# Patient Record
Sex: Female | Born: 1935 | Race: Black or African American | Hispanic: No | Marital: Single | Smoking: Never smoker
Health system: Southern US, Community
[De-identification: ages and names within clinical notes are randomized; demographics above are authoritative.]

## PROBLEM LIST (undated history)

## (undated) DIAGNOSIS — I10 Essential (primary) hypertension: Secondary | ICD-10-CM

## (undated) HISTORY — PX: ABDOMINAL HYSTERECTOMY: SHX81

---

## 2015-10-30 ENCOUNTER — Encounter (HOSPITAL_COMMUNITY): Payer: Self-pay | Admitting: Emergency Medicine

## 2015-10-30 ENCOUNTER — Emergency Department (HOSPITAL_COMMUNITY): Payer: Medicare Other

## 2015-10-30 ENCOUNTER — Emergency Department (HOSPITAL_COMMUNITY)
Admission: EM | Admit: 2015-10-30 | Discharge: 2015-10-30 | Disposition: A | Discharge status: Home / Self Care | Payer: Medicare Other | Attending: Emergency Medicine | Admitting: Emergency Medicine

## 2015-10-30 DIAGNOSIS — Z7902 Long term (current) use of antithrombotics/antiplatelets: Secondary | ICD-10-CM | POA: Insufficient documentation

## 2015-10-30 DIAGNOSIS — Y9289 Other specified places as the place of occurrence of the external cause: Secondary | ICD-10-CM | POA: Insufficient documentation

## 2015-10-30 DIAGNOSIS — Y9389 Activity, other specified: Secondary | ICD-10-CM | POA: Insufficient documentation

## 2015-10-30 DIAGNOSIS — S0191XA Laceration without foreign body of unspecified part of head, initial encounter: Secondary | ICD-10-CM | POA: Insufficient documentation

## 2015-10-30 DIAGNOSIS — W19XXXA Unspecified fall, initial encounter: Secondary | ICD-10-CM

## 2015-10-30 DIAGNOSIS — Z79899 Other long term (current) drug therapy: Secondary | ICD-10-CM | POA: Diagnosis not present

## 2015-10-30 DIAGNOSIS — S0181XA Laceration without foreign body of other part of head, initial encounter: Secondary | ICD-10-CM

## 2015-10-30 DIAGNOSIS — I10 Essential (primary) hypertension: Secondary | ICD-10-CM | POA: Diagnosis not present

## 2015-10-30 DIAGNOSIS — W109XXA Fall (on) (from) unspecified stairs and steps, initial encounter: Secondary | ICD-10-CM | POA: Insufficient documentation

## 2015-10-30 DIAGNOSIS — Y998 Other external cause status: Secondary | ICD-10-CM | POA: Diagnosis not present

## 2015-10-30 DIAGNOSIS — Z880 Allergy status to penicillin: Secondary | ICD-10-CM | POA: Insufficient documentation

## 2015-10-30 DIAGNOSIS — S0990XA Unspecified injury of head, initial encounter: Secondary | ICD-10-CM | POA: Diagnosis present

## 2015-10-30 HISTORY — DX: Essential (primary) hypertension: I10

## 2015-10-30 MED ORDER — TETANUS-DIPHTH-ACELL PERTUSSIS 5-2.5-18.5 LF-MCG/0.5 IM SUSP
0.5000 mL | Freq: Once | INTRAMUSCULAR | Status: AC
  Administered 2015-10-30: 0.5 mL via INTRAMUSCULAR
  Filled 2015-10-30: qty 0.5

## 2015-10-30 MED ORDER — ACETAMINOPHEN 500 MG PO TABS
1000.0000 mg | ORAL_TABLET | Freq: Once | ORAL | Status: AC
  Administered 2015-10-30: 500 mg via ORAL
  Filled 2015-10-30: qty 2

## 2015-10-30 MED ORDER — LIDOCAINE-EPINEPHRINE 1 %-1:100000 IJ SOLN
20.0000 mL | Freq: Once | INTRAMUSCULAR | Status: AC
  Administered 2015-10-30: 1 mL via INTRADERMAL
  Filled 2015-10-30: qty 1

## 2015-10-30 NOTE — ED Provider Notes (Signed)
CSN: [id number]     Arrival date & time 10/30/15  1241 History   First MD Initiated Contact with Patient 10/30/15 1306     Chief Complaint  Patient presents with  . Fall  . Head Injury     (Consider location/radiation/quality/duration/timing/severity/associated sxs/prior Treatment) Patient is a 81 y.o. female presenting with fall and head injury. The history is provided by the patient.  Fall This is a new problem. The current episode started 1 to 2 hours ago. The problem occurs constantly. The problem has not changed since onset.Associated symptoms include headaches. Pertinent negatives include no chest pain, no abdominal pain and no shortness of breath. Nothing aggravates the symptoms. Nothing relieves the symptoms. She has tried nothing for the symptoms. The treatment provided no relief.  Head Injury Associated symptoms: headache   Associated symptoms: no nausea and no vomiting     81 yo F  With a chief complaint of fall. Patient states that she lost her balance and fell down about 3 steps and landed on her face. Complaining of pain to the area of abrasion that hit the concrete. Patient denies other injury denies loss consciousness. Patient is on Plavix.  Denies other blood thinner use.   Past Medical History  Diagnosis Date  . Hypertension    Past Surgical History  Procedure Laterality Date  . Abdominal hysterectomy     No family history on file. Social History  Substance Use Topics  . Smoking status: Never Smoker   . Smokeless tobacco: None  . Alcohol Use: No   OB History    No data available     Review of Systems  Constitutional: Negative for fever and chills.  HENT: Negative for congestion and rhinorrhea.   Eyes: Negative for redness and visual disturbance.  Respiratory: Negative for shortness of breath and wheezing.   Cardiovascular: Negative for chest pain and palpitations.  Gastrointestinal: Negative for nausea, vomiting and abdominal pain.  Genitourinary:  Negative for dysuria and urgency.  Musculoskeletal: Negative for myalgias and arthralgias.  Skin: Positive for wound. Negative for pallor.  Neurological: Positive for headaches. Negative for dizziness.      Allergies  Penicillins  Home Medications   Prior to Admission medications   Medication Sig Start Date End Date Taking? Authorizing Provider  amLODipine-benazepril (LOTREL) 10-40 MG capsule Take 1 capsule by mouth daily.   Yes Historical Provider, MD  clopidogrel (PLAVIX) 75 MG tablet Take 75 mg by mouth daily.   Yes Historical Provider, MD  hydrochlorothiazide (HYDRODIURIL) 25 MG tablet Take 25 mg by mouth daily.   Yes Historical Provider, MD  pravastatin (PRAVACHOL) 10 MG tablet Take 10 mg by mouth daily.   Yes Historical Provider, MD   BP 135/68 mmHg  Pulse 82  Temp(Src) 98.9 F (37.2 C) (Oral)  Resp 12  Ht  (1.702 m)  Wt 210 lb (95.255 kg)  BMI 32.88 kg/m2  SpO2 100% Physical Exam  Constitutional: She is oriented to person, place, and time. She appears well-developed and well-nourished. No distress.  HENT:  Head: Normocephalic and atraumatic.    Eyes: EOM are normal. Pupils are equal, round, and reactive to light.  Neck: Normal range of motion. Neck supple.  Cardiovascular: Normal rate and regular rhythm.  Exam reveals no gallop and no friction rub.   No murmur heard. Pulmonary/Chest: Effort normal. She has no wheezes. She has no rales.  Abdominal: Soft. She exhibits no distension. There is no tenderness. There is no rebound and no guarding.  Musculoskeletal: She exhibits no edema or tenderness.  Neurological: She is alert and oriented to person, place, and time.  Skin: Skin is warm and dry. She is not diaphoretic.     Psychiatric: She has a normal mood and affect. Her behavior is normal.  Nursing note and vitals reviewed.   ED Course  .Marland KitchenLaceration Repair Date/Time: 10/30/2015 3:08 PM Performed by: Adela Lank Lulu Hirschmann Authorized by: Melene Plan Consent: Verbal  consent obtained. Risks and benefits: risks, benefits and alternatives were discussed Consent given by: patient Required items: required blood products, implants, devices, and special equipment available Body area: head/neck Location details: upper lip Full thickness lip laceration: no [place] border involved: no Laceration length: 1 cm Tendon involvement: none Nerve involvement: none Vascular damage: no Anesthesia: local infiltration Local anesthetic: lidocaine 1% with epinephrine Anesthetic total: 4 ml Patient sedated: no Preparation: Patient was prepped and draped in the usual sterile fashion. Irrigation solution: saline Irrigation method: jet lavage Debridement: none Degree of undermining: none Wound skin closure material used: vicryl rapide. Number of sutures: 2 Technique: simple Approximation: close Approximation difficulty: simple Patient tolerance: Patient tolerated the procedure well with no immediate complications  .Marland KitchenLaceration Repair Date/Time: 10/30/2015 3:09 PM Performed by: Adela Lank Reet Scharrer Authorized by: Melene Plan Consent: Verbal consent obtained. Risks and benefits: risks, benefits and alternatives were discussed Consent given by: patient Time out: Immediately prior to procedure a "time out" was called to verify the correct patient, procedure, equipment, support staff and site/side marked as required. Body area: head/neck Location details: forehead Laceration length: 4 cm Tendon involvement: none Nerve involvement: none Vascular damage: no Anesthesia: local infiltration Local anesthetic: lidocaine 1% with epinephrine Anesthetic total: 6 ml Patient sedated: no Preparation: Patient was prepped and draped in the usual sterile fashion. Irrigation method: syringe Amount of cleaning: extensive Debridement: none Degree of undermining: none Wound skin closure material used: vicryl rapide. Number of sutures: 2 Technique: complex (stellate laceration with  avulsion, difficult to approximate) Approximation: close Approximation difficulty: complex Patient tolerance: Patient tolerated the procedure well with no immediate complications   (including critical care time) Labs Review Labs Reviewed - No data to display  Imaging Review Ct Head Wo Contrast  10/30/2015  CLINICAL DATA:  Fall down stairs with pain and facial swelling, initial encounter EXAM: CT HEAD WITHOUT CONTRAST CT MAXILLOFACIAL WITHOUT CONTRAST CT CERVICAL SPINE WITHOUT CONTRAST TECHNIQUE: Multidetector CT imaging of the head, cervical spine, and maxillofacial structures were performed using the standard protocol without intravenous contrast. Multiplanar CT image reconstructions of the cervical spine and maxillofacial structures were also generated. COMPARISON:  None. FINDINGS: CT HEAD FINDINGS The bony calvarium is intact. Very mild frontal soft tissue hematoma is noted consistent with the recent injury. Areas of chronic white matter ischemic change are noted. No findings to suggest acute hemorrhage, acute infarction or space-occupying mass lesion is noted. 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 CT MAXILLOFACIAL FINDINGS Forehead hematoma is again seen. The bony structures show no acute abnormality. Mild mucosal thickening is noted within the maxillary antra bilaterally. The orbits and their contents are within normal limits. No significant facial swelling is seen. CT CERVICAL SPINE FINDINGS Seven cervical segments are well visualized. Mild osteophytic changes are noted. Mild facet hypertrophic changes are seen as well. No acute fracture or acute facet abnormality is noted. The thyroid is enlarged in the left lobe with an apparent 3 cm nodule within. Scattered nodules are noted throughout the remainder of the thyroid. IMPRESSION: CT  of the head: Chronic ischemic changes without acute abnormality. CT of the maxillofacial bones: Frontal soft tissue  hematoma without acute bony abnormality. CT of the cervical spine: Degenerative changes without acute abnormality Thyroid nodules. Dedicated thyroid ultrasound can be performed on a nonemergent basis for further evaluation. Electronically Signed   By: Alcide Clever M.D.   On: 10/30/2015 14:02   Ct Cervical Spine Wo Contrast  10/30/2015  CLINICAL DATA:  Fall down stairs with pain and facial swelling, initial encounter EXAM: CT HEAD WITHOUT CONTRAST CT MAXILLOFACIAL WITHOUT CONTRAST CT CERVICAL SPINE WITHOUT CONTRAST TECHNIQUE: Multidetector CT imaging of the head, cervical spine, and maxillofacial structures were performed using the standard protocol without intravenous contrast. Multiplanar CT image reconstructions of the cervical spine and maxillofacial structures were also generated. COMPARISON:  None. FINDINGS: CT HEAD FINDINGS The bony calvarium is intact. Very mild frontal soft tissue hematoma is noted consistent with the recent injury. Areas of chronic white matter ischemic change are noted. No findings to suggest acute hemorrhage, acute infarction or space-occupying mass lesion is noted. 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 CT MAXILLOFACIAL FINDINGS Forehead hematoma is again seen. The bony structures show no acute abnormality. Mild mucosal thickening is noted within the maxillary antra bilaterally. The orbits and their contents are within normal limits. No significant facial swelling is seen. CT CERVICAL SPINE FINDINGS Seven cervical segments are well visualized. Mild osteophytic changes are noted. Mild facet hypertrophic changes are seen as well. No acute fracture or acute facet abnormality is noted. The thyroid is enlarged in the left lobe with an apparent 3 cm nodule within. Scattered nodules are noted throughout the remainder of the thyroid. IMPRESSION: CT of the head: Chronic ischemic changes without acute abnormality. CT of the maxillofacial bones:  Frontal soft tissue hematoma without acute bony abnormality. CT of the cervical spine: Degenerative changes without acute abnormality Thyroid nodules. Dedicated thyroid ultrasound can be performed on a nonemergent basis for further evaluation. Electronically Signed   By: Alcide Clever M.D.   On: 10/30/2015 14:02   Ct Maxillofacial Wo Cm  10/30/2015  CLINICAL DATA:  Fall down stairs with pain and facial swelling, initial encounter EXAM: CT HEAD WITHOUT CONTRAST CT MAXILLOFACIAL WITHOUT CONTRAST CT CERVICAL SPINE WITHOUT CONTRAST TECHNIQUE: Multidetector CT imaging of the head, cervical spine, and maxillofacial structures were performed using the standard protocol without intravenous contrast. Multiplanar CT image reconstructions of the cervical spine and maxillofacial structures were also generated. COMPARISON:  None. FINDINGS: CT HEAD FINDINGS The bony calvarium is intact. Very mild frontal soft tissue hematoma is noted consistent with the recent injury. Areas of chronic white matter ischemic change are noted. No findings to suggest acute hemorrhage, acute infarction or space-occupying mass lesion is noted. 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 CT MAXILLOFACIAL FINDINGS Forehead hematoma is again seen. The bony structures show no acute abnormality. Mild mucosal thickening is noted within the maxillary antra bilaterally. The orbits and their contents are within normal limits. No significant facial swelling is seen. CT CERVICAL SPINE FINDINGS Seven cervical segments are well visualized. Mild osteophytic changes are noted. Mild facet hypertrophic changes are seen as well. No acute fracture or acute facet abnormality is noted. The thyroid is enlarged in the left lobe with an apparent 3 cm nodule within. Scattered nodules are noted throughout the remainder of the thyroid. IMPRESSION: CT of the head: Chronic ischemic changes without acute abnormality.  CT of the maxillofacial  bones: Frontal soft tissue hematoma without acute bony abnormality. CT of the cervical spine: Degenerative changes without acute abnormality Thyroid nodules. Dedicated thyroid ultrasound can be performed on a nonemergent basis for further evaluation. Electronically Signed   By: Alcide CleverMark  Lukens M.D.   On: 10/30/2015 14:02   I have personally reviewed and evaluated these images and lab results as part of my medical decision-making.   EKG Interpretation None      MDM   Final diagnoses:  Fall, initial encounter  Facial laceration, initial encounter    81 yo F  With a chief complaint of a fall. Patient is on Plavix will obtain a CT of the head CT C-spine. Laceration to the forehead will be repaired at bedside. Update tetanus. Patient denies any other injury. CT of the head C-spine and face negative.  3:10 PM:  I have discussed the diagnosis/risks/treatment options with the patient and family and believe the pt to be eligible for discharge home to follow-up with PCP. We also discussed returning to the ED immediately if new or worsening sx occur. We discussed the sx which are most concerning (e.g., sudden worsening pain, fever, redness or drainage from site) that necessitate immediate return. Medications administered to the patient during their visit and any new prescriptions provided to the patient are listed below.  Medications given during this visit Medications  Tdap (BOOSTRIX) injection 0.5 mL (0.5 mLs Intramuscular Given 10/30/15 1433)  acetaminophen (TYLENOL) tablet 1,000 mg (500 mg Oral Given 10/30/15 1458)  lidocaine-EPINEPHrine (XYLOCAINE W/EPI) 1 %-1:100000 (with pres) injection 20 mL (1 mL Intradermal Given by Other 10/30/15 1424)    New Prescriptions   No medications on file    The patient appears reasonably screen and/or stabilized for discharge and I doubt any other medical condition or other [facility] requiring further screening, evaluation, or treatment in the ED at this time prior to  discharge.      Melene Planan Yajayra Feldt, DO 10/30/15 1510

## 2015-10-30 NOTE — Discharge Instructions (Signed)
Being on Plavix,  You are at Increased risk of a delayed bleed.  And he suddenly have worsening headache any upper lower extremity weakness confusion uncontrolled vomiting return immediately to the emergency department  Laceration Care, Adult A laceration is a cut that goes through all of the layers of the skin and into the tissue that is right under the skin. Some lacerations heal on their own. Others need to be closed with stitches (sutures), staples, skin adhesive strips, or skin glue. Proper laceration care minimizes the risk of infection and helps the laceration to heal better. HOW TO CARE FOR YOUR LACERATION If sutures or staples were used:  Keep the wound clean and dry.  If you were given a bandage (dressing), you should change it at least one time per day or as told by your health care provider. You should also change it if it becomes wet or dirty.  Keep the wound completely dry for the first 24 hours or as told by your health care provider. After that time, you may shower or bathe. However, make sure that the wound is not soaked in water until after the sutures or staples have been removed.  Clean the wound one time each day or as told by your health care provider:  Wash the wound with soap and water.  Rinse the wound with water to remove all soap.  Pat the wound dry with a clean towel. Do not rub the wound.  After cleaning the wound, apply a thin layer of antibiotic ointmentas told by your health care provider. This will help to prevent infection and keep the dressing from sticking to the wound.  Have the sutures or staples removed as told by your health care provider. If skin adhesive strips were used:  Keep the wound clean and dry.  If you were given a bandage (dressing), you should change it at least one time per day or as told by your health care provider. You should also change it if it becomes dirty or wet.  Do not get the skin adhesive strips wet. You may shower or  bathe, but be careful to keep the wound dry.  If the wound gets wet, pat it dry with a clean towel. Do not rub the wound.  Skin adhesive strips fall off on their own. You may trim the strips as the wound heals. Do not remove skin adhesive strips that are still stuck to the wound. They will fall off in time. If skin glue was used:  Try to keep the wound dry, but you may briefly wet it in the shower or bath. Do not soak the wound in water, such as by swimming.  After you have showered or bathed, gently pat the wound dry with a clean towel. Do not rub the wound.  Do not do any activities that will make you sweat heavily until the skin glue has fallen off on its own.  Do not apply liquid, cream, or ointment medicine to the wound while the skin glue is in place. Using those may loosen the film before the wound has healed.  If you were given a bandage (dressing), you should change it at least one time per day or as told by your health care provider. You should also change it if it becomes dirty or wet.  If a dressing is placed over the wound, be careful not to apply tape directly over the skin glue. Doing that may cause the glue to be pulled off before  the wound has healed.  Do not pick at the glue. The skin glue usually remains in place for 5-10 days, then it falls off of the skin. General Instructions  Take over-the-counter and prescription medicines only as told by your health care provider.  If you were prescribed an antibiotic medicine or ointment, take or apply it as told by your doctor. Do not stop using it even if your condition improves.  To help prevent scarring, make sure to cover your wound with sunscreen whenever you are outside after stitches are removed, after adhesive strips are removed, or when glue remains in place and the wound is healed. Make sure to wear a sunscreen of at least 30 SPF.  Do not scratch or pick at the wound.  Keep all follow-up visits as told by your health  care provider. This is important.  Check your wound every day for signs of infection. Watch for:  Redness, swelling, or pain.  Fluid, blood, or pus.  Raise (elevate) the injured area above the level of your heart while you are sitting or lying down, if possible. SEEK MEDICAL CARE IF:  You received a tetanus shot and you have swelling, severe pain, redness, or bleeding at the injection site.  You have a fever.  A wound that was closed breaks open.  You notice a bad smell coming from your wound or your dressing.  You notice something coming out of the wound, such as wood or glass.  Your pain is not controlled with medicine.  You have increased redness, swelling, or pain at the site of your wound.  You have fluid, blood, or pus coming from your wound.  You notice a change in the color of your skin near your wound.  You need to change the dressing frequently due to fluid, blood, or pus draining from the wound.  You develop a new rash.  You develop numbness around the wound. SEEK IMMEDIATE MEDICAL CARE IF:  You develop severe swelling around the wound.  Your pain suddenly increases and is severe.  You develop painful lumps near the wound or on skin that is anywhere on your body.  You have a red streak going away from your wound.  The wound is on your hand or foot and you cannot properly move a finger or toe.  The wound is on your hand or foot and you notice that your fingers or toes look pale or bluish.   This information is not intended to replace advice given to you by your health care provider. Make sure you discuss any questions you have with your health care provider.   Document Released: 10/29/2005 Document Revised: 03/15/2015 Document Reviewed: 10/25/2014 Elsevier Interactive Patient Education [employer]2016 [employer]

## 2015-10-30 NOTE — ED Notes (Signed)
Pt c/o trip and fall down 3 outside steps causing head and face injury. Pt denies + LOC. Pt takes blood thinner plavix. Pt has lac to forehead and abrasion to nose.

## 2015-10-30 NOTE — ED Notes (Signed)
Patient remains alert and oriented.  Sutures placed to forehead and under nare  Patient refused to take all 1000mg  of tylenol.   500mg  given.  Patient daughter verbalized understanding of d/c instructions and reasons to return.  Patient verbalized understanding as well.  Patient with dressing placed to forehead and supplies given to family

## 2015-10-30 NOTE — ED Notes (Signed)
Introduced self to patient prior to going to CT.  Patient is alert and oriented.  Family at bedside.  Obvious swelling with facial injury post fall.  She is going to xray

## 2016-10-23 IMAGING — CT CT HEAD W/O CM
5 of 9 series · 19 of 47 positions shown, 21 images · non-contrast
Comparison: None.

CLINICAL DATA: Fall down stairs with pain and facial swelling,
initial encounter

EXAM:
CT HEAD WITHOUT CONTRAST
CT MAXILLOFACIAL WITHOUT CONTRAST
CT CERVICAL SPINE WITHOUT CONTRAST
TECHNIQUE: Multidetector CT imaging of the head, cervical spine, and
maxillofacial structures were performed using the standard protocol
without intravenous contrast. Multiplanar CT image reconstructions
of the cervical spine and maxillofacial structures were also
generated.

[Series 301: facial bones, idose (1) · axial · 0.39mm/px · z∈[+181,+233]mm · 3 of 80 slices shown]
[im 14/80  brain]
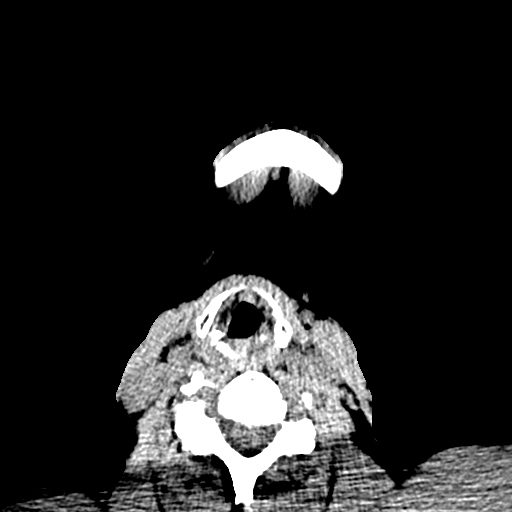
[im 27/80  brain]
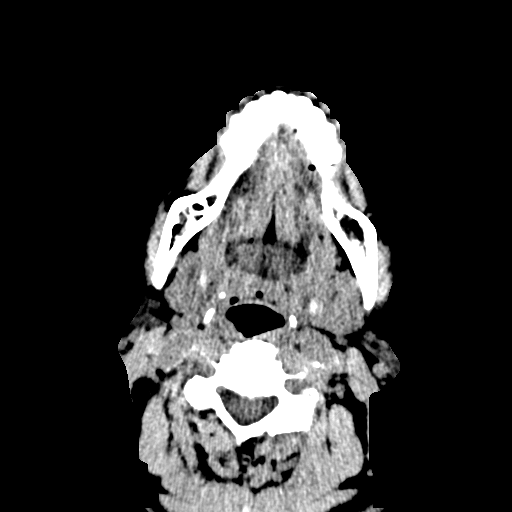
[im 40/80  brain]
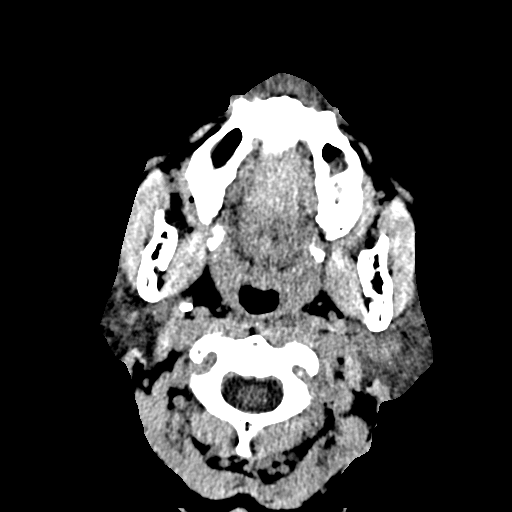

[Series 304: sagittal std, idose (1) · sagittal · 0.33mm/px · 2 of 99 slices shown]
[im 33/99  brain]
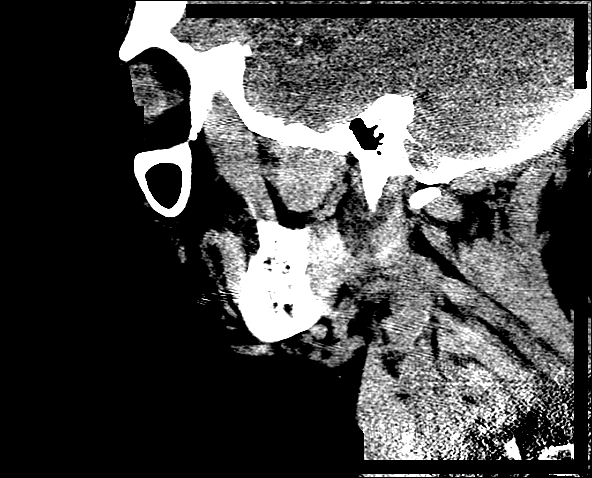
[im 66/99  brain]
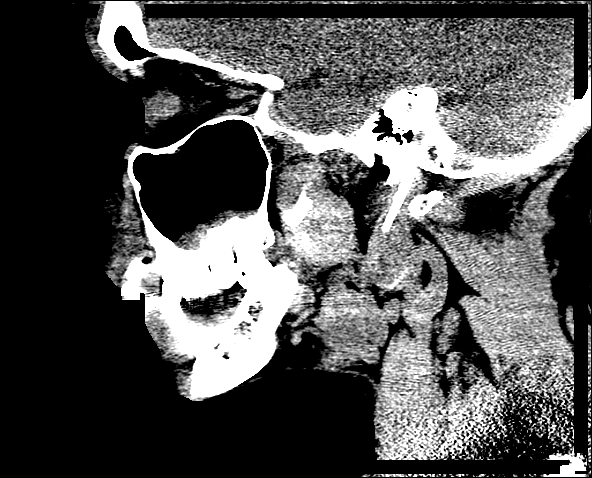

[Series 402: soft tissue, idose (2) · axial · 0.36mm/px · z∈[+124,+260]mm · 6 of 96 slices shown, 8 images]
[im 14/96  brain]
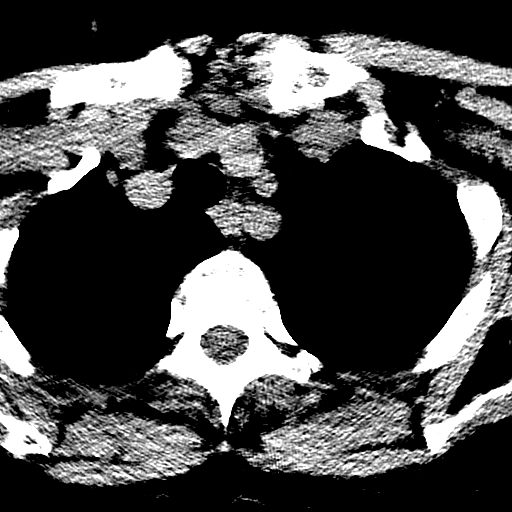
[im 14/96  bone]
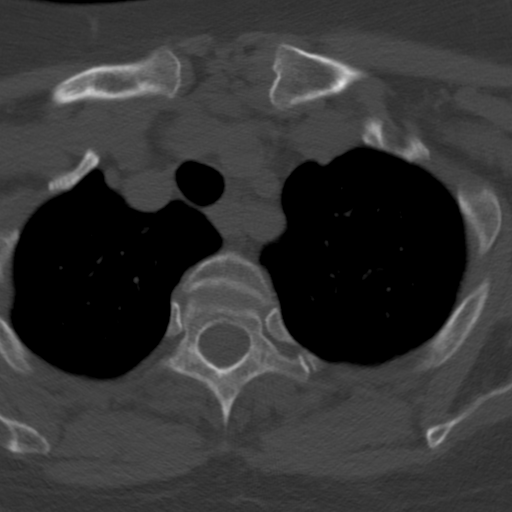
[im 28/96  brain]
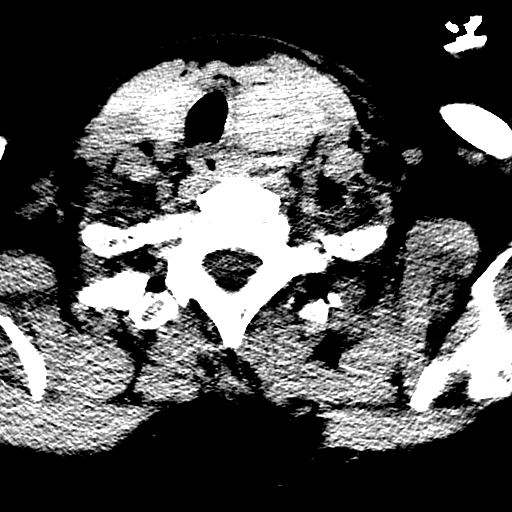
[im 41/96  brain]
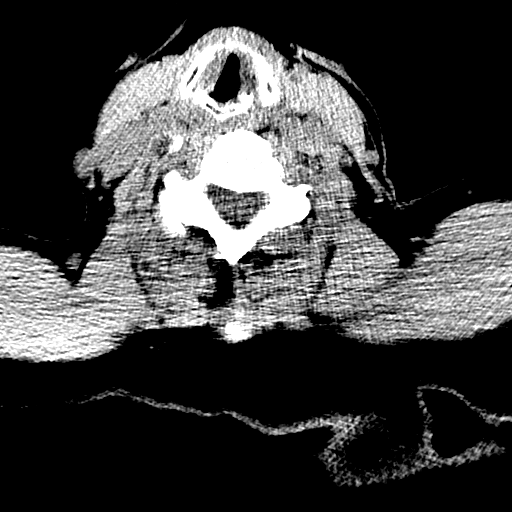
[im 55/96  brain]
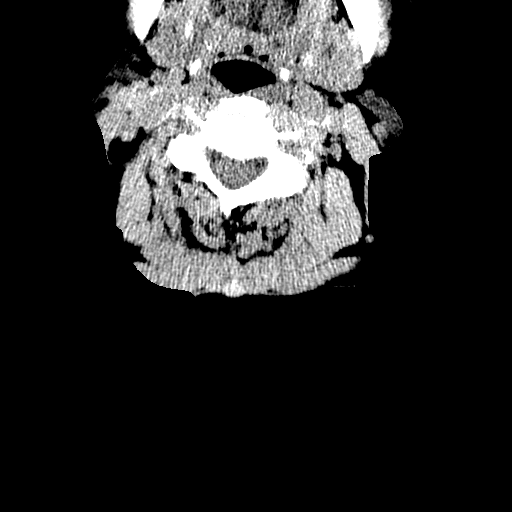
[im 68/96  brain]
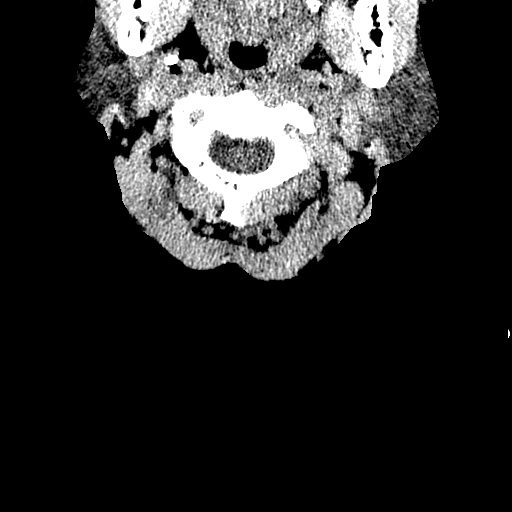
[im 68/96  bone]
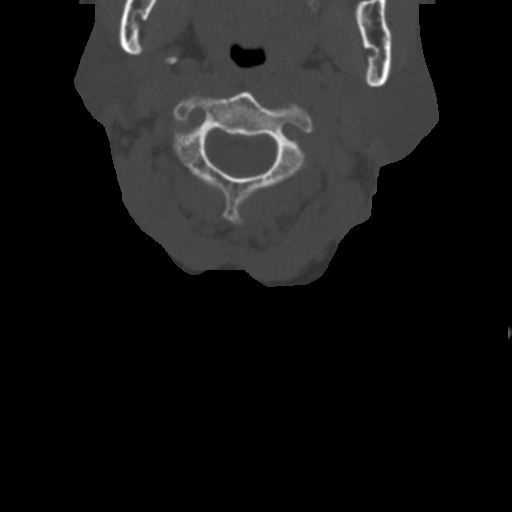
[im 82/96  brain]
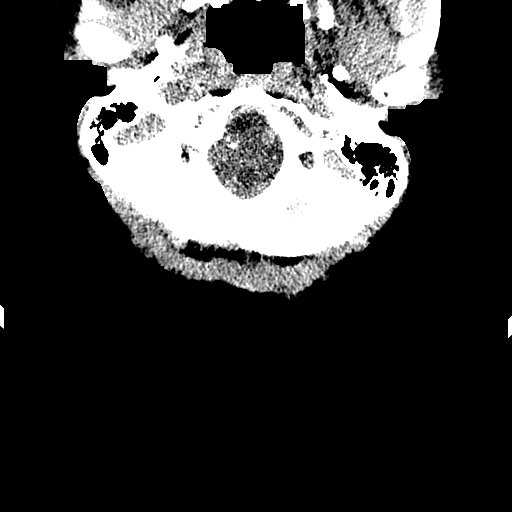

[Series 404: coronal, idose (2) · coronal · 0.36mm/px · 2 of 55 slices shown]
[im 19/55  brain]
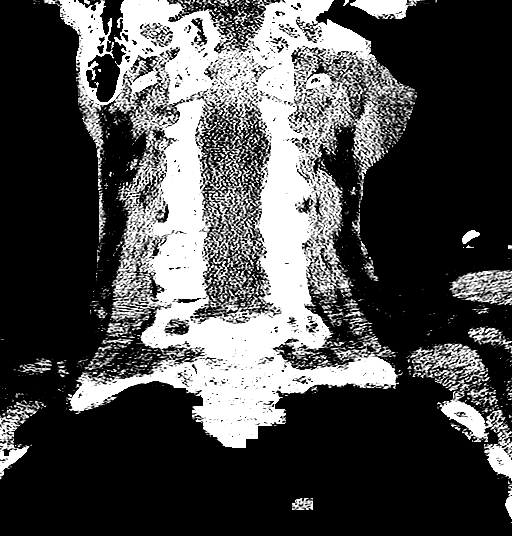
[im 37/55  brain]
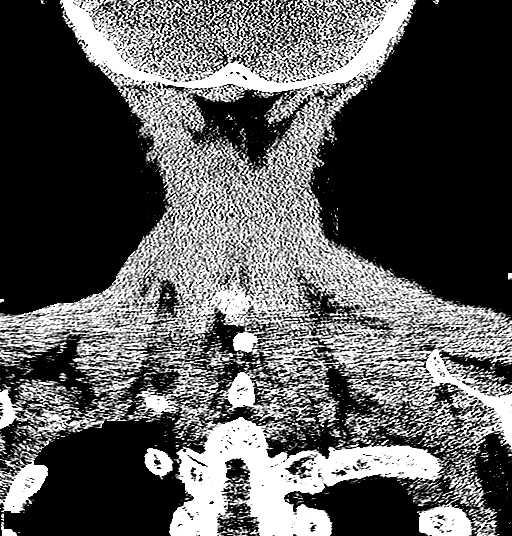

[Series 406: orthogonals, idose (2) · axial · 0.41mm/px · z∈[+109,+227]mm · 6 of 93 slices shown]
[im 14/93  brain]
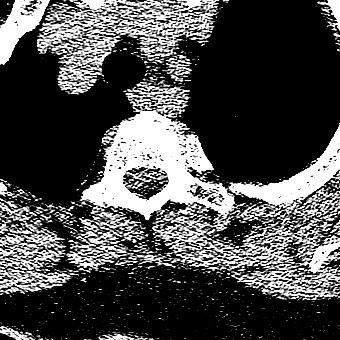
[im 27/93  brain]
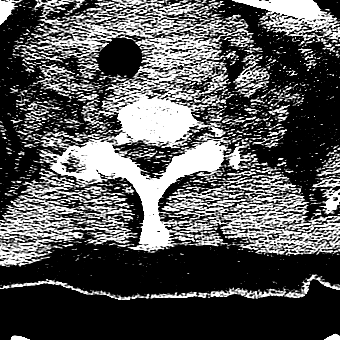
[im 40/93  brain]
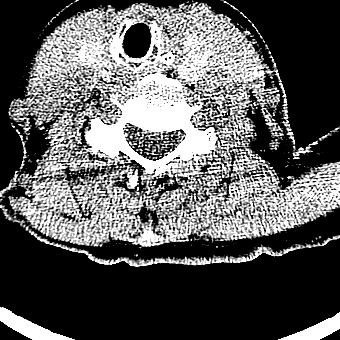
[im 53/93  brain]
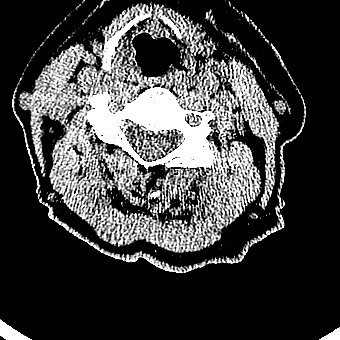
[im 66/93  brain]
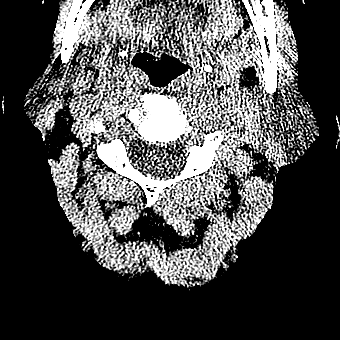
[im 79/93  brain]
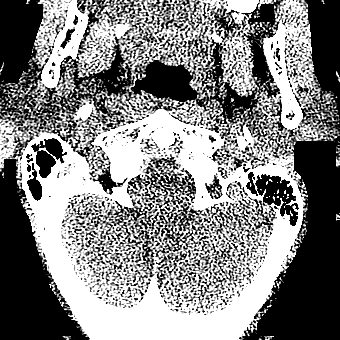

[19 of 47 positions shown; findings below may reference images not displayed]

FINDINGS: CT HEAD FINDINGS

The bony calvarium is intact. Very mild frontal soft tissue hematoma
is noted consistent with the recent injury. Areas of chronic white
matter ischemic change are noted. No findings to suggest acute
hemorrhage, acute infarction or space-occupying mass lesion is
noted. An old lacunar infarct is noted in the anterior limb of the
left internal capsule.

CT MAXILLOFACIAL FINDINGS

Forehead hematoma is again seen. The bony structures show no acute
abnormality. Mild mucosal thickening is noted within the maxillary
antra bilaterally. The orbits and their contents are within normal
limits. No significant facial swelling is seen.

CT CERVICAL SPINE FINDINGS

Seven cervical segments are well visualized. Mild osteophytic
changes are noted. Mild facet hypertrophic changes are seen as well.
No acute fracture or acute facet abnormality is noted. The thyroid
is enlarged in the left lobe with an apparent 3 cm nodule within.
Scattered nodules are noted throughout the remainder of the thyroid.
IMPRESSION: CT of the head: Chronic ischemic changes without acute abnormality.

CT of the maxillofacial bones: Frontal soft tissue hematoma without
acute bony abnormality.

CT of the cervical spine: Degenerative changes without acute
abnormality

Thyroid nodules. Dedicated thyroid ultrasound can be performed on a
nonemergent basis for further evaluation.

## 2024-10-01 NOTE — Progress Notes (Signed)
 Cardiology Office Note:   Date:  10/01/2024  ID:  Kristen Conrad, DOB May 06, 1933, MRN [id number] PCP: Freddrick, No  [facility] Providers Cardiologist:  Georganna Archer Chief Complaint:  Chief Complaint  Patient presents with   Chest Pain      History of Present Illness:   Kristen Conrad is a 89 y.o. female with a PMH of HTN, HLD who presents as a new patient referral by Dr. Lamar Bare for an abnormal ECG.  Patient presents today via her PCP for the evaluation of an abnormal ECG. No record of the ECG is present in the chart so I am unable to see it.  Today the patient reports having two fleeting episodes of chest pain over the past two days. The chest pains occurred while the patient was sitting watching TV at rest. They were left sided underneath the L breast without radiation. No associated symptoms of SOB, diaphoresis, N/V, syncope or presyncope.   The patient lives in [place]  but is visiting family down here and has been here for the past 5 months.  She denies tobacco, alcohol, illicit drug use.  She reports that she has a history of a  brain aneurysm and is taking Plavix.  She is largely confined to a wheelchair and is not very physically active.  No additional complaints or concerns.  Past Medical History:  Diagnosis Date   Hypertension      Studies Reviewed:    EKG:  EKG Interpretation Date/Time:  Friday October 02 2024 09:19:27 EST Ventricular Rate:  93 PR Interval:  168 QRS Duration:  128 QT Interval:  368 QTC Calculation: 457 R Axis:   -50  Text Interpretation: Normal sinus rhythm Left axis deviation Left bundle branch block Left ventricular hypertrophy with repolarization abnormality No previous ECGs available Confirmed by Archer Georganna [phone]) on 10/02/2024 9:24:06 AM         Risk Assessment/Calculations:           Physical Exam:     VS:  BP (!) 174/100   Pulse 93   Ht 5' 7 (1.702 m)   Wt 185 lb 6.4 oz (84.1 kg)   SpO2 96%   BMI  29.04 kg/m      Wt Readings from Last 3 Encounters:  10/30/15 210 lb (95.3 kg)     GEN: Well nourished, well developed, in no acute distress NECK: No JVD; No carotid bruits CARDIAC: RRR, no murmurs, rubs, gallops RESPIRATORY:  Clear to auscultation without rales, wheezing or rhonchi  ABDOMEN: Soft, non-tender, non-distended, normal bowel sounds EXTREMITIES:  Warm and well perfused, no edema; No deformity, 2+ radial pulses PSYCH: Normal mood and affect   Assessment & Plan Precordial chest pain - Patient had 2 fleeting episodes of chest pain over the past 2 days.  She has a LBBB of undetermined duration on her EKG.  These more ischemic evaluation particularly given her risk factors. CCTA Complete echo Follow-up in 6 months Left bundle branch block - LBBB and LVH on EKG. -No prior ECG for comparison. -Structural and ischemic evaluation as described above. CCTA Complete echo Primary hypertension - Blood pressure is very elevated today. -The medicines that she is taking for her blood pressure are actually different than what is listed in the chart. -I will add amlodipine  10 mg daily to hopefully improve blood pressure control. -I instructed the patient to check her blood pressure twice daily for the next 2 weeks to let me know the results. Continue spironolactone 25 mg daily  Continue valsartan 160 mg daily Start amlodipine  10 mg daily 2-week blood pressure log Follow-up in 1 month with APP for blood pressure check Mixed hyperlipidemia - Check lipid panel today. Brain aneurysm - Share with me that she has a brain aneurysm and may be thus why she is on Plavix. -No history of any stents are atherosclerotic cardiovascular disease. -The patient is going to check in with her primary to see what history of brain aneurysm she has and will let me know. -Depending on what she finds we may continue versus discontinue Plavix.          This note was written with the assistance of a  dictation microphone or AI dictation software. Please excuse any typos or grammatical errors.   Signed, Georganna Archer, MD 10/01/2024 6:39 PM    [facility]

## 2024-10-02 ENCOUNTER — Ambulatory Visit
Attending: Student in an Organized Health Care Education/Training Program | Admitting: Student in an Organized Health Care Education/Training Program

## 2024-10-02 ENCOUNTER — Encounter: Payer: Self-pay | Admitting: Student in an Organized Health Care Education/Training Program

## 2024-10-02 ENCOUNTER — Other Ambulatory Visit (HOSPITAL_COMMUNITY): Payer: Self-pay

## 2024-10-02 VITALS — BP 174/100 | HR 93 | Ht 67.0 in | Wt 185.4 lb

## 2024-10-02 DIAGNOSIS — I671 Cerebral aneurysm, nonruptured: Secondary | ICD-10-CM | POA: Diagnosis present

## 2024-10-02 DIAGNOSIS — R072 Precordial pain: Secondary | ICD-10-CM | POA: Insufficient documentation

## 2024-10-02 DIAGNOSIS — I517 Cardiomegaly: Secondary | ICD-10-CM | POA: Diagnosis present

## 2024-10-02 DIAGNOSIS — I447 Left bundle-branch block, unspecified: Secondary | ICD-10-CM | POA: Insufficient documentation

## 2024-10-02 DIAGNOSIS — E782 Mixed hyperlipidemia: Secondary | ICD-10-CM | POA: Diagnosis present

## 2024-10-02 DIAGNOSIS — I10 Essential (primary) hypertension: Secondary | ICD-10-CM | POA: Diagnosis present

## 2024-10-02 DIAGNOSIS — R9431 Abnormal electrocardiogram [ECG] [EKG]: Secondary | ICD-10-CM | POA: Insufficient documentation

## 2024-10-02 LAB — BASIC METABOLIC PANEL WITH GFR
BUN/Creatinine Ratio: 21 (ref 12–28)
BUN: 13 mg/dL (ref 8–27)
CO2: 24 mmol/L (ref 20–29)
Calcium: 9.2 mg/dL (ref 8.7–10.3)
Chloride: 103 mmol/L (ref 96–106)
Creatinine, Ser: 0.61 mg/dL (ref 0.57–1.00)
Glucose: 82 mg/dL (ref 70–99)
Potassium: 4.4 mmol/L (ref 3.5–5.2)
Sodium: 141 mmol/L (ref 134–144)
eGFR: 85 mL/min/1.73 (ref >59)

## 2024-10-02 LAB — LIPID PANEL
Chol/HDL Ratio: 2.9 ratio (ref 0.0–4.4)
Cholesterol, Total: 192 mg/dL (ref 100–199)
HDL: 66 mg/dL (ref >39)
LDL Chol Calc (NIH): 116 mg/dL — ABNORMAL HIGH (ref 0–99)
Triglycerides: 54 mg/dL (ref 0–149)
VLDL Cholesterol Cal: 10 mg/dL (ref 5–40)

## 2024-10-02 MED ORDER — METOPROLOL TARTRATE 100 MG PO TABS
100.0000 mg | ORAL_TABLET | Freq: Once | ORAL | 0 refills | Status: AC
  Filled 2024-10-02: qty 1, 1d supply, fill #0

## 2024-10-02 MED ORDER — AMLODIPINE BESYLATE 10 MG PO TABS: 10.0000 mg | ORAL_TABLET | Freq: Every day | ORAL | 3 refills | Status: AC

## 2024-10-02 NOTE — Patient Instructions (Signed)
 Medication Instructions:  START Amlodipine  10 mg daily   ON DAY OF CT SCAN:  metoprolol  tartrate (LOPRESSOR ) 100 MG tablet         Take 1 tablet (100 mg total) by mouth once for 1 dose. Take 90-120 minutes prior to scan. Hold for SBP less than 110   *If you need a refill on your cardiac medications before your next appointment, please call your pharmacy*  Lab Work: Bmp Lipid panel   If you have labs (blood work) drawn today and your tests are completely normal, you will receive your results only by: MyChart Message (if you have MyChart) OR A paper copy in the mail If you have any lab test that is abnormal or we need to change your treatment, we will call you to review the results.  Testing/Procedures: Echocardiogram  Your physician has requested that you have an echocardiogram. Echocardiography is a painless test that uses sound waves to create images of your heart. It provides your doctor with information about the size and shape of your heart and how well your heart's chambers and valves are working. This procedure takes approximately one hour. There are no restrictions for this procedure. Please do NOT wear cologne, perfume, aftershave, or lotions (deodorant is allowed). Please arrive 15 minutes prior to your appointment time.  Please note: We ask at that you not bring children with you during ultrasound (echo/ vascular) testing. Due to room size and safety concerns, children are not allowed in the ultrasound rooms during exams. Our front office staff cannot provide observation of children in our lobby area while testing is being conducted. An adult accompanying a patient to their appointment will only be allowed in the ultrasound room at the discretion of the ultrasound technician under special circumstances. We apologize for any inconvenience.  CORONARY CTA    Your cardiac CT will be scheduled at one of the below locations:   [facility] [address]  [place], [place] [address]  If scheduled at the [facility] at [address], please enter the parking lot using the [address] entrance and use the FREE valet service at the patient drop-off area. Enter the building and check-in with registration on the main floor.  Please follow these instructions carefully (unless otherwise directed):  An IV will be required for this test and Nitroglycerin will be given.  Hold all erectile dysfunction medications at least 3 days (72 hrs) prior to test. (Ie viagra, cialis, sildenafil, tadalafil, etc)   On the Night Before the Test: Be sure to Drink plenty of water. Do not consume any caffeinated/decaffeinated beverages or chocolate 12 hours prior to your test. Do not take any antihistamines 12 hours prior to your test.   On the Day of the Test: Drink plenty of water until 1 hour prior to the test. Do not eat any food 1 hour prior to test. You may take your regular medications prior to the test.  Take metoprolol  (Lopressor ) two hours prior to test. If you take Furosemide/Hydrochlorothiazide/Spironolactone/Chlorthalidone, please HOLD on the morning of the test. Patients who wear a continuous glucose monitor MUST remove the device prior to scanning. FEMALES- please wear underwire-free bra if available, avoid dresses & tight clothing      After the Test: Drink plenty of water. After receiving IV contrast, you may experience a mild flushed feeling. This is normal. On occasion, you may experience a mild rash up to 24 hours after the test. This  is not dangerous. If this occurs, you can take Benadryl 25 mg, Zyrtec, Claritin, or Allegra and increase your fluid intake. (Patients taking Tikosyn should avoid Benadryl, and may take Zyrtec, Claritin, or Allegra) If you experience trouble breathing, this can be serious. If it is severe call 911 IMMEDIATELY. If it is mild, please call our office.  We will call to schedule your test 2-4  weeks out understanding that some insurance companies will need an authorization prior to the service being performed.   For more information and frequently asked questions, please visit our website : [email or web address]  For non-scheduling related questions, please contact the cardiac imaging nurse navigator should you have any questions/concerns: Cardiac Imaging Nurse Navigators Direct Office Dial: [phone]   For scheduling needs, including cancellations and rescheduling, please call Brittany, [phone].    Follow-Up: At [facility], you and your health needs are our priority.  As part of our continuing mission to provide you with exceptional heart care, our providers are all part of one team.  This team includes your primary Cardiologist (physician) and Advanced Practice Providers or APPs (Physician Assistants and Nurse Practitioners) who all work together to provide you with the care you need, when you need it.  Your next appointment:   1 month(s)  Provider:   One of our Advanced Practice Providers (APPs): Morse Clause, PA-C  Lamarr Satterfield, NP Miriam Shams, NP  Olivia Pavy, PA-C Josefa Beauvais, NP  Leontine Salen, PA-C Orren Fabry, PA-C  [place], PA-C Ernest Dick, NP  Damien Braver, NP Jon Hails, PA-C  Waddell Donath, PA-C    Dayna Dunn, PA-C  Scott Weaver, PA-C Lum Louis, NP Katlyn West, NP Callie Goodrich, PA-C  Xika Zhao, NP Sheng Haley, PA-C    Kathleen Johnson, PA-C   Then, Georganna Archer, MD will plan to see you again in 6 month(s).     CHECK BLOOD PRESSURE FOR 2 WEEKS AND CONTACT OFFICE TO ADVISE RESULTS:  Blood Pressure Record Sheet To take your blood pressure, you will need a blood pressure machine. You can buy a blood pressure machine (blood pressure monitor) at your clinic, drug store, or online. When choosing one, consider: An automatic monitor that has an arm cuff. A cuff that wraps snugly around your upper arm.  You should be able to fit only one finger between your arm and the cuff. A device that stores blood pressure reading results. Do not choose a monitor that measures your blood pressure from your wrist or finger. Follow your health care provider's instructions for how to take your blood pressure. To use this form: Take your blood pressure medications every day These measurements should be taken when you have been at rest for at least 10-15 min Take at least 2 readings with each blood pressure check. This makes sure the results are correct. Wait 1-2 minutes between measurements. Write down the results in the spaces on this form. Keep in mind it should always be recorded systolic over diastolic. Both numbers are important.  Repeat this every day for 2-3 weeks, or as told by your health care provider.  Make a follow-up appointment with your health care provider to discuss the results.  Blood Pressure Log Date Medications taken? (Y/N) Blood Pressure Time of Day

## 2024-10-05 ENCOUNTER — Ambulatory Visit: Payer: Self-pay | Admitting: Student in an Organized Health Care Education/Training Program

## 2024-10-06 NOTE — Progress Notes (Signed)
 Unable to leave message, will try again at later time.

## 2024-10-06 NOTE — Progress Notes (Signed)
 Attempted to call patient in regard to her lipid results. No answer, could not leave a vm due to vm being full

## 2024-10-14 ENCOUNTER — Other Ambulatory Visit (HOSPITAL_COMMUNITY): Payer: Self-pay

## 2024-10-19 ENCOUNTER — Telehealth: Payer: Self-pay | Admitting: Student in an Organized Health Care Education/Training Program

## 2024-10-19 MED ORDER — ROSUVASTATIN CALCIUM 10 MG PO TABS: 10.0000 mg | ORAL_TABLET | Freq: Every day | ORAL | 3 refills | Status: AC

## 2024-10-19 NOTE — Progress Notes (Signed)
 Attempted to call patient in regard to her lipid results. No answer, could not leave a vm due to vm being full

## 2024-10-19 NOTE — Telephone Encounter (Signed)
 I called the patient in response to her inquiry about performing an alternative test to the CCTA. She does not want exposure to contrast. She is agreeable to performing a pharmacologic NM SPECT instead. This will be ordered.  Informed Consent   Shared Decision Making/Informed Consent The risks [chest pain, shortness of breath, cardiac arrhythmias, dizziness, blood pressure fluctuations, myocardial infarction, stroke/transient ischemic attack, nausea, vomiting, allergic reaction, radiation exposure, metallic taste sensation and life-threatening complications (estimated to be 1 in 10,000)], benefits (risk stratification, diagnosing coronary artery disease, treatment guidance) and alternatives of a nuclear stress test were discussed in detail with Kristen Conrad and she agrees to proceed.        Zyire Eidson T. Floretta HEATH, MD [facility]  10/19/2024 4:52 PM

## 2024-10-22 NOTE — Progress Notes (Signed)
 Unable to leave message, will try again at later time.

## 2024-10-27 NOTE — Progress Notes (Signed)
 Unable to leave message, will try again at later time.

## 2024-11-20 ENCOUNTER — Ambulatory Visit: Admitting: Emergency Medicine

## 2024-11-20 ENCOUNTER — Telehealth (HOSPITAL_COMMUNITY): Payer: Self-pay | Admitting: Student in an Organized Health Care Education/Training Program

## 2024-11-20 ENCOUNTER — Ambulatory Visit (HOSPITAL_COMMUNITY)

## 2024-11-20 NOTE — Telephone Encounter (Signed)
 Patient cancelled echocardiogram for reason below:  11/19/2024 4:13 PM By: WILSON, JASMIN B  Cancel Rsn: Patient (Daughter Alston) stated patient does not want to do test)  Order will be removed from the active echo WQ. Thank you.
